# Patient Record
Sex: Female | Born: 1955
Health system: Southern US, Community
[De-identification: ages and names within clinical notes are randomized; demographics above are authoritative.]

## PROBLEM LIST (undated history)

## (undated) DIAGNOSIS — I10 Essential (primary) hypertension: Secondary | ICD-10-CM

## (undated) HISTORY — PX: ABDOMINAL HYSTERECTOMY: SHX81

---

## 2008-11-15 ENCOUNTER — Other Ambulatory Visit: Admission: RE | Admit: 2008-11-15 | Discharge: 2008-11-15 | Payer: Self-pay | Admitting: Obstetrics and Gynecology

## 2014-04-23 ENCOUNTER — Encounter (HOSPITAL_BASED_OUTPATIENT_CLINIC_OR_DEPARTMENT_OTHER): Payer: Self-pay | Admitting: Emergency Medicine

## 2014-04-23 ENCOUNTER — Emergency Department (HOSPITAL_BASED_OUTPATIENT_CLINIC_OR_DEPARTMENT_OTHER)
Admission: EM | Admit: 2014-04-23 | Discharge: 2014-04-23 | Disposition: A | Discharge status: Home / Self Care | Payer: Federal, State, Local not specified - PPO | Attending: Emergency Medicine | Admitting: Emergency Medicine

## 2014-04-23 DIAGNOSIS — Z79899 Other long term (current) drug therapy: Secondary | ICD-10-CM | POA: Insufficient documentation

## 2014-04-23 DIAGNOSIS — I10 Essential (primary) hypertension: Secondary | ICD-10-CM | POA: Diagnosis not present

## 2014-04-23 DIAGNOSIS — H6501 Acute serous otitis media, right ear: Secondary | ICD-10-CM | POA: Diagnosis not present

## 2014-04-23 DIAGNOSIS — H9201 Otalgia, right ear: Secondary | ICD-10-CM | POA: Diagnosis present

## 2014-04-23 HISTORY — DX: Essential (primary) hypertension: I10

## 2014-04-23 MED ORDER — AMOXICILLIN 500 MG PO CAPS: 500.0000 mg | ORAL_CAPSULE | Freq: Three times a day (TID) | ORAL | Status: AC

## 2014-04-23 NOTE — ED Notes (Signed)
Right ear pain x one week.  No fever or drainage.  Muffled hearing.

## 2014-04-23 NOTE — ED Provider Notes (Signed)
CSN: [id number]     Arrival date & time 04/23/14  1841 History   First MD Initiated Contact with Patient 04/23/14 1941     Chief Complaint  Patient presents with  . Otalgia     (Consider location/radiation/quality/duration/timing/severity/associated sxs/prior Treatment) HPI Comments: Pt states that she feel like sounds are muffled.   Patient is a 58 y.o. female presenting with ear pain. The history is provided by the patient. No language interpreter was used.  Otalgia Location:  Right Behind ear:  No abnormality Quality:  Aching Severity:  Mild Onset quality:  Gradual Timing:  Constant Progression:  Unchanged Associated symptoms: no ear discharge and no fever     Past Medical History  Diagnosis Date  . Hypertension    Past Surgical History  Procedure Laterality Date  . Abdominal hysterectomy     No family history on file. History  Substance Use Topics  . Smoking status: Never Smoker   . Smokeless tobacco: Not on file  . Alcohol Use: Not on file   OB History   Grav Para Term Preterm Abortions TAB SAB Ect Mult Living                 Review of Systems  Constitutional: Negative for fever.  HENT: Positive for ear pain. Negative for ear discharge.       Allergies  Review of patient's allergies indicates no known allergies.  Home Medications   Prior to Admission medications   Medication Sig Start Date End Date Taking? Authorizing Provider  Fish Oil-Cholecalciferol (FISH OIL + D3 PO) Take by mouth.   Yes Historical Provider, MD  hydrochlorothiazide (HYDRODIURIL) 12.5 MG tablet Take 12.5 mg by mouth daily.   Yes Historical Provider, MD  losartan (COZAAR) 100 MG tablet Take 100 mg by mouth daily.   Yes Historical Provider, MD  amoxicillin (AMOXIL) 500 MG capsule Take 1 capsule (500 mg total) by mouth 3 (three) times daily. 04/23/14   Teressa LowerVrinda Lucille Crichlow, NP   BP 160/80  Pulse 74  Temp(Src) 98.4 F (36.9 C) (Oral)  Resp 18  Ht 5\' 4"  (1.626 m)  Wt 207 lb (93.895  kg)  BMI 35.51 kg/m2  SpO2 98% Physical Exam  Nursing note and vitals reviewed. Constitutional: She appears well-developed and well-nourished.  HENT:  Head: Atraumatic.  Right Ear: Tympanic membrane and external ear normal.  Left Ear: There is tenderness. Tympanic membrane is erythematous.  Mouth/Throat: Oropharynx is clear and moist.  Cardiovascular: Normal rate and regular rhythm.   Pulmonary/Chest: Effort normal and breath sounds normal.    ED Course  Procedures (including critical care time) Labs Review Labs Reviewed - No data to display  Imaging Review No results found.   EKG Interpretation None      MDM   Final diagnoses:  Right acute serous otitis media, recurrence not specified    Will treat with antibiotics. Discussed follow up as needed. No jaw pain or neck pain.     Teressa LowerVrinda Damonie Furney, NP 04/23/14 [phone]

## 2014-04-23 NOTE — Discharge Instructions (Signed)

## 2014-04-24 NOTE — ED Provider Notes (Signed)
Medical screening examination/treatment/procedure(s) were performed by non-physician practitioner and as supervising physician I was immediately available for consultation/collaboration.   EKG Interpretation None        Matthew Gentry, MD 04/24/14 2348 

## 2018-06-10 ENCOUNTER — Emergency Department (HOSPITAL_BASED_OUTPATIENT_CLINIC_OR_DEPARTMENT_OTHER): Payer: Federal, State, Local not specified - PPO

## 2018-06-10 ENCOUNTER — Encounter (HOSPITAL_BASED_OUTPATIENT_CLINIC_OR_DEPARTMENT_OTHER): Payer: Self-pay | Admitting: Emergency Medicine

## 2018-06-10 ENCOUNTER — Other Ambulatory Visit: Payer: Self-pay

## 2018-06-10 ENCOUNTER — Emergency Department (HOSPITAL_BASED_OUTPATIENT_CLINIC_OR_DEPARTMENT_OTHER)
Admission: EM | Admit: 2018-06-10 | Discharge: 2018-06-10 | Disposition: A | Discharge status: Home / Self Care | Payer: Federal, State, Local not specified - PPO | Attending: Emergency Medicine | Admitting: Emergency Medicine

## 2018-06-10 DIAGNOSIS — Z79899 Other long term (current) drug therapy: Secondary | ICD-10-CM | POA: Insufficient documentation

## 2018-06-10 DIAGNOSIS — I10 Essential (primary) hypertension: Secondary | ICD-10-CM | POA: Insufficient documentation

## 2018-06-10 DIAGNOSIS — R05 Cough: Secondary | ICD-10-CM | POA: Insufficient documentation

## 2018-06-10 DIAGNOSIS — R059 Cough, unspecified: Secondary | ICD-10-CM

## 2018-06-10 MED ORDER — BENZONATATE 100 MG PO CAPS: 100.0000 mg | ORAL_CAPSULE | Freq: Three times a day (TID) | ORAL | 0 refills | Status: AC

## 2018-06-10 MED ORDER — PREDNISONE 10 MG (21) PO TBPK: ORAL_TABLET | ORAL | 0 refills | Status: AC

## 2018-06-10 MED ORDER — FLUTICASONE PROPIONATE 50 MCG/ACT NA SUSP: 2.0000 | Freq: Every day | NASAL | 0 refills | Status: AC

## 2018-06-10 MED ORDER — AZITHROMYCIN 250 MG PO TABS: 250.0000 mg | ORAL_TABLET | Freq: Every day | ORAL | 0 refills | Status: AC

## 2018-06-10 MED ORDER — CETIRIZINE HCL 10 MG PO TABS: 10.0000 mg | ORAL_TABLET | Freq: Every day | ORAL | 0 refills | Status: AC

## 2018-06-10 MED FILL — CETIRIZINE HCL 10 MG TABS: 10 | 100 days supply | Qty: 100 | Fill #0

## 2018-06-10 MED FILL — predniSONE 10 MG TABS: 10 | 6 days supply | Qty: 21 | Fill #0

## 2018-06-10 MED FILL — AZITHROMYCIN 250 MG TABLET: 250 | 5 days supply | Qty: 6 | Fill #0

## 2018-06-10 MED FILL — FLUTICASONE PROP 50 MCG SPR: 50 | 30 days supply | Qty: 16 | Fill #0

## 2018-06-10 MED FILL — BENZONATATE 100 MG CAP: 100 | 7 days supply | Qty: 21 | Fill #0

## 2018-06-10 NOTE — ED Provider Notes (Signed)
[facility] EMERGENCY DEPARTMENT Provider Note   CSN: [id number] Arrival date & time: 06/10/18  0906     History   Chief Complaint Chief Complaint  Patient presents with  . Cough    HPI Amanda Stephens is a 62 y.o. female.  HPI   Amanda Stephens is a 62 y.o. female, with a history of HTN, presenting to the ED with productive cough for the last 3 weeks that she states has recently worsened.  Accompanied by nasal congestion. Denies fever, body aches, shortness of breath, chest pain, abdominal pain, N/V/D, or any other complaints.   Past Medical History:  Diagnosis Date  . Hypertension     There are no active problems to display for this patient.   Past Surgical History:  Procedure Laterality Date  . ABDOMINAL HYSTERECTOMY       OB History   None      Home Medications    Prior to Admission medications   Medication Sig Start Date End Date Taking? Authorizing Provider  amoxicillin (AMOXIL) 500 MG capsule Take 1 capsule (500 mg total) by mouth 3 (three) times daily. 04/23/14   Teressa Lower, NP  azithromycin (ZITHROMAX) 250 MG tablet Take 1 tablet (250 mg total) by mouth daily. Take first 2 tablets together, then 1 every day until finished. 06/10/18   Joy, Shawn C, PA-C  benzonatate (TESSALON) 100 MG capsule Take 1 capsule (100 mg total) by mouth every 8 (eight) hours. 06/10/18   Joy, Shawn C, PA-C  cetirizine (ZYRTEC ALLERGY) 10 MG tablet Take 1 tablet (10 mg total) by mouth daily. 06/10/18 07/10/18  Joy, Shawn C, PA-C  Fish Oil-Cholecalciferol (FISH OIL + D3 PO) Take by mouth.    [provider]  fluticasone (FLONASE) 50 MCG/ACT nasal spray Place 2 sprays into both nostrils daily. 06/10/18   Joy, Shawn C, PA-C  hydrochlorothiazide (HYDRODIURIL) 12.5 MG tablet Take 12.5 mg by mouth daily.    [provider]  losartan (COZAAR) 100 MG tablet Take 100 mg by mouth daily.    [provider]  predniSONE (STERAPRED UNI-PAK 21 TAB) 10 MG  (21) TBPK tablet Take 6 tabs (60mg ) day 1, 5 tabs (50mg ) day 2, 4 tabs (40mg ) day 3, 3 tabs (30mg ) day 4, 2 tabs (20mg ) day 5, and 1 tab (10mg ) day 6. 06/10/18   Joy, Hillard Danker, PA-C    Family History History reviewed. No pertinent family history.  Social History Social History   Tobacco Use  . Smoking status: Never Smoker  Substance Use Topics  . Alcohol use: Not on file  . Drug use: Not on file     Allergies   Patient has no known allergies.   Review of Systems Review of Systems  Constitutional: Negative for chills and fever.  HENT: Positive for congestion and rhinorrhea. Negative for trouble swallowing and voice change.   Respiratory: Positive for cough. Negative for shortness of breath.   Cardiovascular: Negative for chest pain.  Gastrointestinal: Negative for abdominal pain, diarrhea, nausea and vomiting.  All other systems reviewed and are negative.    Physical Exam Updated Vital Signs BP 138/87   Pulse 75   Temp 98.3 F (36.8 C) (Oral)   Resp 20   Ht 5\' 4"  (1.626 m)   Wt 94.3 kg   SpO2 100%   BMI 35.70 kg/m   Physical Exam  Constitutional: She appears well-developed and well-nourished. No distress.  HENT:  Head: Normocephalic and atraumatic.  Nose: Mucosal edema present. Right  sinus exhibits no maxillary sinus tenderness and no frontal sinus tenderness. Left sinus exhibits no maxillary sinus tenderness and no frontal sinus tenderness.  Eyes: Conjunctivae are normal.  Neck: Neck supple.  Cardiovascular: Normal rate, regular rhythm, normal heart sounds and intact distal pulses.  Pulmonary/Chest: Effort normal and breath sounds normal. No respiratory distress.  Abdominal: Soft. There is no tenderness. There is no guarding.  Musculoskeletal: She exhibits no edema.  Lymphadenopathy:    She has no cervical adenopathy.  Neurological: She is alert.  Skin: Skin is warm and dry. She is not diaphoretic.  Psychiatric: She has a normal mood and affect. Her behavior  is normal.  Nursing note and vitals reviewed.    ED Treatments / Results  Labs (all labs ordered are listed, but only abnormal results are displayed) Labs Reviewed - No data to display  EKG None  Radiology Dg Chest 2 View  Result Date: 06/10/2018 CLINICAL DATA:  Cough and chest congestion for the past 3 weeks. EXAM: CHEST - 2 VIEW COMPARISON:  None. FINDINGS: The lungs are adequately inflated. There is no focal infiltrate. There is no pleural effusion. The heart and pulmonary vascularity are normal. The mediastinum is normal in width. The bony thorax is unremarkable. IMPRESSION: There is no pneumonia, CHF, nor other acute cardiopulmonary abnormality. Electronically Signed   By: David  SwazilandJordan M.D.   On: 06/10/2018 09:54    Procedures Procedures (including critical care time)  Medications Ordered in ED Medications - No data to display   Initial Impression / Assessment and Plan / ED Course  I have reviewed the triage vital signs and the nursing notes.  Pertinent labs & imaging results that were available during my care of the patient were reviewed by me and considered in my medical decision making (see chart for details).     Patient presents with cough and upper respiratory congestion.  Symptomatic care discussed.  Antibiotic initiated due to duration of symptoms and recent worsening. The patient was given instructions for home care as well as return precautions. Patient voices understanding of these instructions, accepts the plan, and is comfortable with discharge.    Final Clinical Impressions(s) / ED Diagnoses   Final diagnoses:  Cough    ED Discharge Orders         Ordered    predniSONE (STERAPRED UNI-PAK 21 TAB) 10 MG (21) TBPK tablet     06/10/18 1027    fluticasone (FLONASE) 50 MCG/ACT nasal spray  Daily     06/10/18 1027    cetirizine (ZYRTEC ALLERGY) 10 MG tablet  Daily     06/10/18 1027    azithromycin (ZITHROMAX) 250 MG tablet  Daily     06/10/18 1027     benzonatate (TESSALON) 100 MG capsule  Every 8 hours     06/10/18 1044           Anselm PancoastJoy, Shawn C, PA-C 06/10/18 2203    Geoffery Lyonselo, Douglas, MD 06/13/18 1511

## 2018-06-10 NOTE — Discharge Instructions (Addendum)
°  Hand washing: Wash your hands throughout the day, but especially before and after touching the face, using the restroom, sneezing, coughing, or touching surfaces that have been coughed or sneezed upon. Hydration: Symptoms will be intensified and complicated by dehydration. Dehydration can also extend the duration of symptoms. Drink plenty of fluids and get plenty of rest. You should be drinking at least half a liter of water an hour to stay hydrated. Electrolyte drinks (ex. Gatorade, Powerade, Pedialyte) are also encouraged. You should be drinking enough fluids to make your urine light yellow, almost clear. If this is not the case, you are not drinking enough water. Please note that some of the treatments indicated below will not be effective if you are not adequately hydrated. Pain or fever: Ibuprofen, Naproxen, or acetaminophen (generic for Tylenol) for pain or fever.  Cough: Use the benzonatate (generic for Tessalon) for cough.  Prednisone: Take the prednisone, as directed, in its entirety. Zyrtec or Claritin: May add these medication daily to control underlying symptoms of congestion, sneezing, and other signs of allergies.  These medications are available over-the-counter. Generics: Cetirizine (generic for Zyrtec) and loratadine (generic for Claritin). Fluticasone: Use fluticasone (generic for Flonase), as directed, for nasal and sinus congestion.  This medication is available over-the-counter. Congestion: Plain guaifenesin (generic for plain Mucinex) may help relieve congestion. Saline sinus rinses and saline nasal sprays may also help relieve congestion.  Sore throat: Warm liquids or Chloraseptic spray may help soothe a sore throat. Gargle twice a day with a salt water solution made from a half teaspoon of salt in a cup of warm water.  Follow up: Follow up with a primary care provider within the next two weeks should symptoms fail to resolve. Return: Return to the ED for significantly worsening  symptoms, shortness of breath, persistent vomiting, or any other major concerns.  For prescription assistance, may try using prescription discount sites or apps, such as goodrx.com

## 2018-06-10 NOTE — ED Triage Notes (Signed)
Reports productive cough x 3 weeks.  Denies fevers.

## 2019-10-27 IMAGING — DX DG CHEST 2V
2 series · 2 of 2 positions shown · non-contrast
Comparison: None.

CLINICAL DATA: Cough and chest congestion for the past 3 weeks.

EXAM:
CHEST - 2 VIEW

[chest pa]
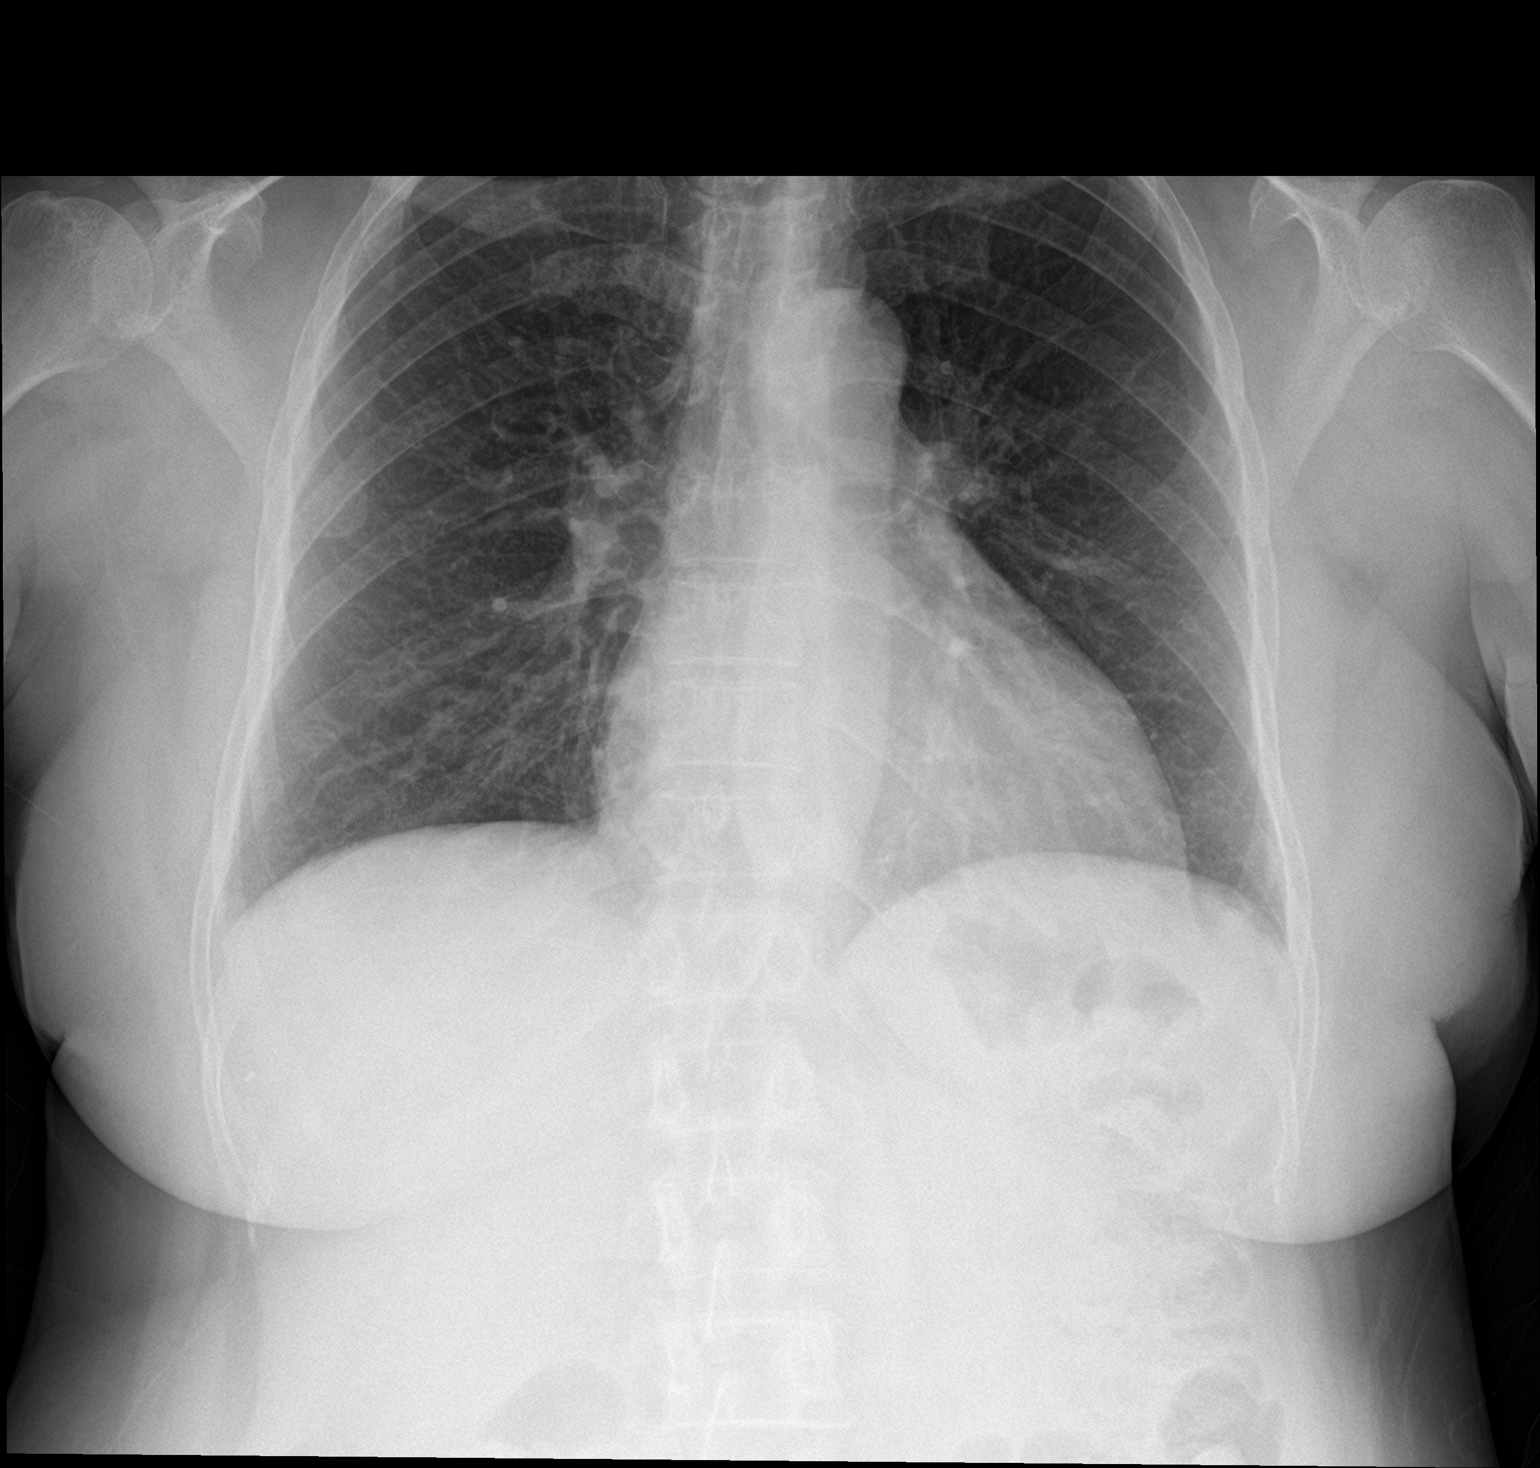

[chest lat]
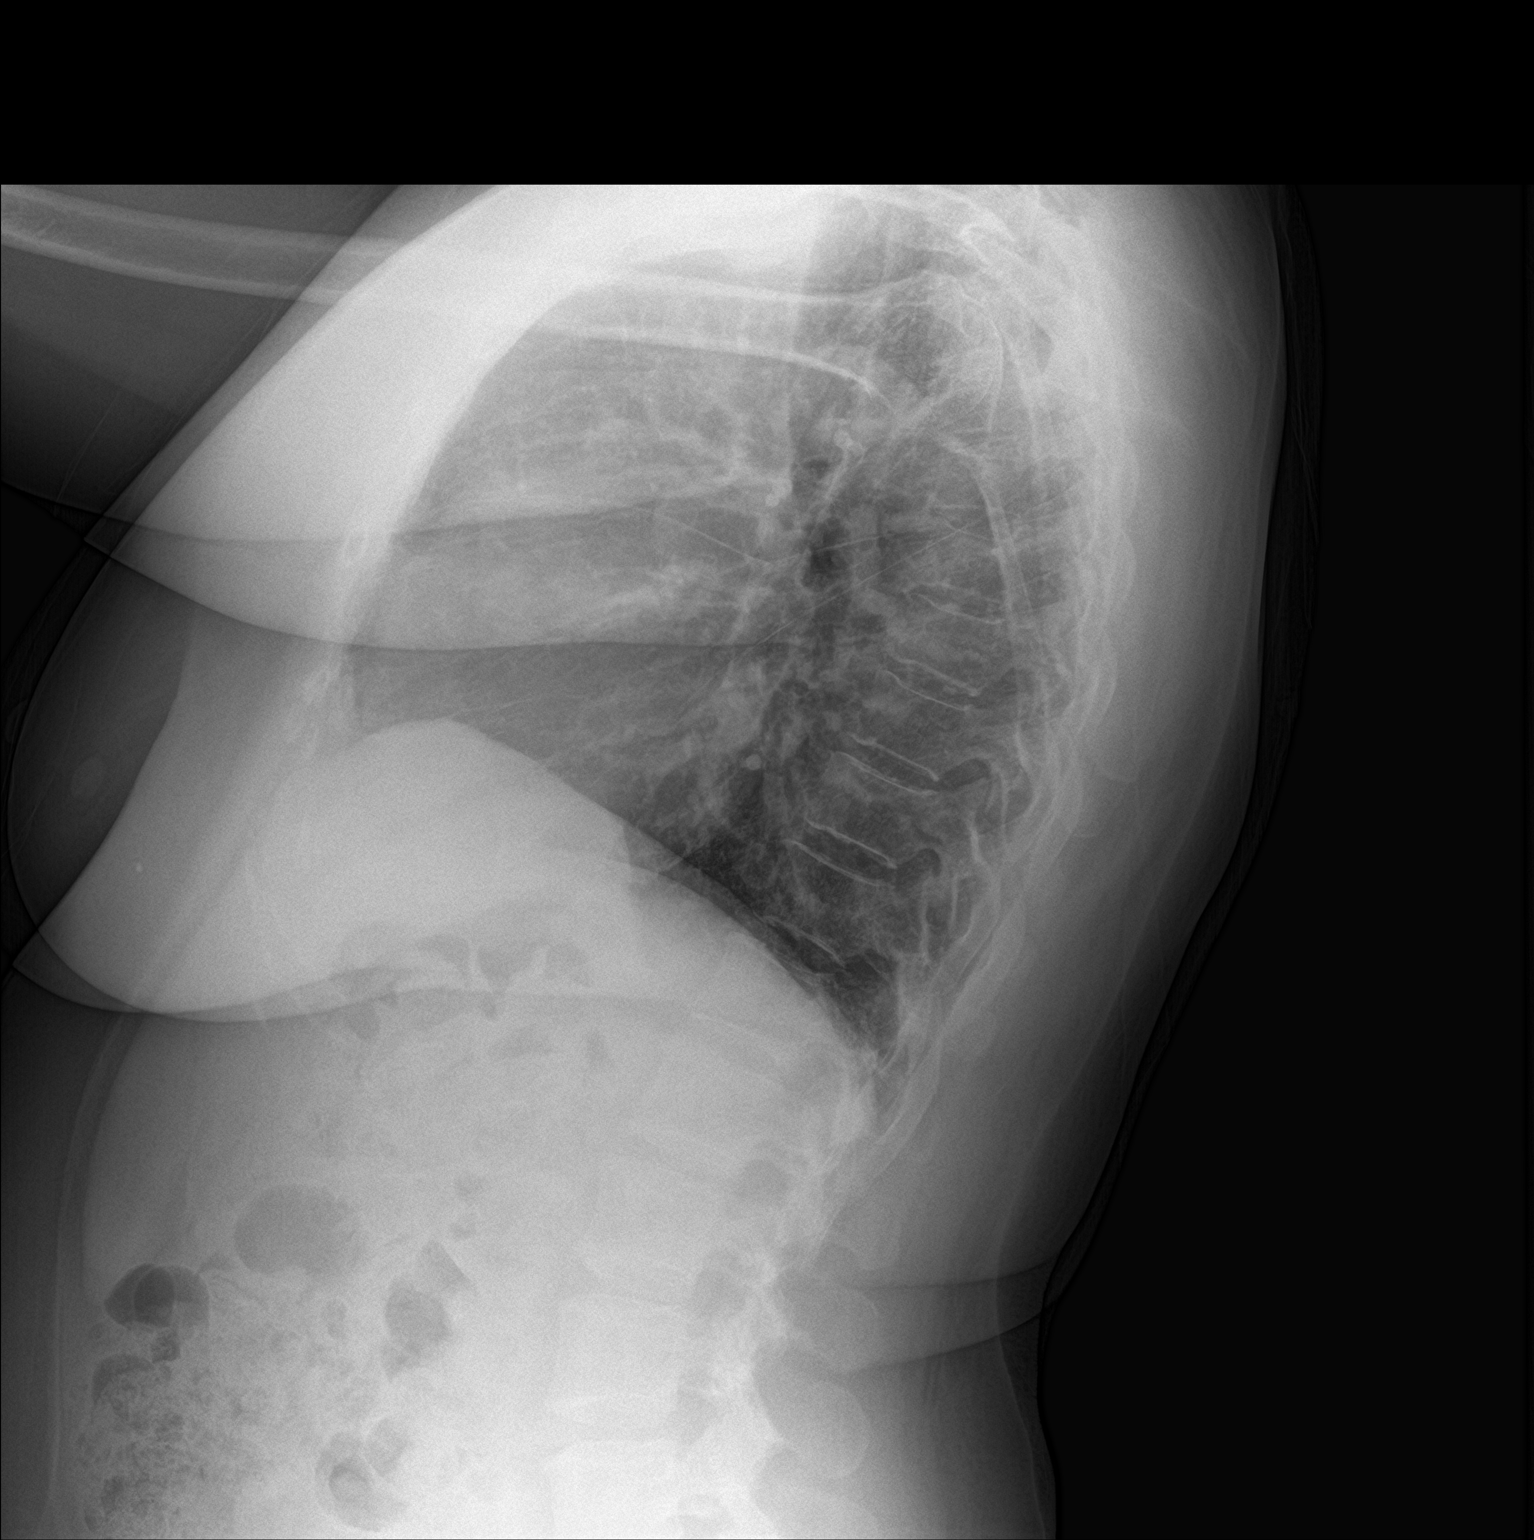

[2 of 2 positions shown; findings below may reference images not displayed]

FINDINGS: The lungs are adequately inflated. There is no focal infiltrate.
There is no pleural effusion. The heart and pulmonary vascularity
are normal. The mediastinum is normal in width. The bony thorax is
unremarkable.
IMPRESSION: There is no pneumonia, CHF, nor other acute cardiopulmonary
abnormality.

## 2023-12-10 ENCOUNTER — Emergency Department (HOSPITAL_BASED_OUTPATIENT_CLINIC_OR_DEPARTMENT_OTHER)
Admission: EM | Admit: 2023-12-10 | Discharge: 2023-12-10 | Disposition: A | Discharge status: Home / Self Care | Attending: Emergency Medicine | Admitting: Emergency Medicine

## 2023-12-10 ENCOUNTER — Other Ambulatory Visit: Payer: Self-pay

## 2023-12-10 DIAGNOSIS — H109 Unspecified conjunctivitis: Secondary | ICD-10-CM | POA: Insufficient documentation

## 2023-12-10 DIAGNOSIS — H5712 Ocular pain, left eye: Secondary | ICD-10-CM | POA: Diagnosis present

## 2023-12-10 MED ORDER — ERYTHROMYCIN 5 MG/GM OP OINT
TOPICAL_OINTMENT | Freq: Once | OPHTHALMIC | Status: AC
  Filled 2023-12-10: qty 3.5

## 2023-12-10 MED ORDER — FLUORESCEIN SODIUM 1 MG OP STRP
1.0000 | ORAL_STRIP | Freq: Once | OPHTHALMIC | Status: AC
  Administered 2023-12-10: 1 via OPHTHALMIC
  Filled 2023-12-10: qty 1

## 2023-12-10 MED ORDER — TETRACAINE HCL 0.5 % OP SOLN
2.0000 [drp] | Freq: Once | OPHTHALMIC | Status: AC
  Administered 2023-12-10: 2 [drp] via OPHTHALMIC
  Filled 2023-12-10: qty 4

## 2023-12-10 NOTE — ED Provider Notes (Signed)
 [facility]ENC[facility] DEPARTMENT AT [facility] Provider Note   CSN: [id number] Arrival date & time: 12/10/23  0815     History  Chief Complaint  Patient presents with   Eye Problem    Amanda Stephens is a 68 y.o. female here with left eye pain and redness for 2 days.  Felt like "something was stuck in my eye" yesterday and more painful this morning.  Denies trauma.  Does not wear contacts.  Denies hx of eye surgery or diabetes.   Patient reports that she does not typically wear mascara but they try to put some on 2 days ago and has had reactions and irritation to mascara in the past.  She said it was a new bottle  HPI     Home Medications Prior to Admission medications   Medication Sig Start Date End Date Taking? Authorizing Provider  amoxicillin  (AMOXIL ) 500 MG capsule Take 1 capsule (500 mg total) by mouth 3 (three) times daily. 04/23/14   Pickering, Vrinda, NP  azithromycin  (ZITHROMAX ) 250 MG tablet Take 1 tablet (250 mg total) by mouth daily. Take first 2 tablets together, then 1 every day until finished. 06/10/18   Joy, Shawn C, PA-C  benzonatate  (TESSALON ) 100 MG capsule Take 1 capsule (100 mg total) by mouth every 8 (eight) hours. 06/10/18   Joy, Shawn C, PA-C  cetirizine  (ZYRTEC  ALLERGY) 10 MG tablet Take 1 tablet (10 mg total) by mouth daily. 06/10/18 07/10/18  Joy, Shawn C, PA-C  Fish Oil-Cholecalciferol (FISH OIL + D3 PO) Take by mouth.    [provider]  fluticasone  (FLONASE ) 50 MCG/ACT nasal spray Place 2 sprays into both nostrils daily. 06/10/18   Joy, Shawn C, PA-C  hydrochlorothiazide (HYDRODIURIL) 12.5 MG tablet Take 12.5 mg by mouth daily.    [provider]  losartan (COZAAR) 100 MG tablet Take 100 mg by mouth daily.    [provider]  predniSONE  (STERAPRED UNI-PAK 21 TAB) 10 MG (21) TBPK tablet Take 6 tabs (60mg ) day 1, 5 tabs (50mg ) day 2, 4 tabs (40mg ) day 3, 3 tabs (30mg ) day 4, 2 tabs (20mg ) day 5, and 1 tab (10mg ) day 6.  06/10/18   Joy, Shawn C, PA-C      Allergies    Patient has no known allergies.    Review of Systems   Review of Systems  Physical Exam Updated Vital Signs BP 139/71   Pulse 97   Temp 98.2 F (36.8 C) (Oral)   Resp 18   SpO2 100%  Physical Exam Constitutional:      General: She is not in acute distress. HENT:     Head: Normocephalic and atraumatic.  Eyes:     Pupils: Pupils are equal, round, and reactive to light.     Comments: Left conjunctiva injected No hypopion or hyphema noted Vision 20/30 both eyes No abnormal fluorescein uptake noted  Cardiovascular:     Rate and Rhythm: Normal rate and regular rhythm.  Pulmonary:     Effort: Pulmonary effort is normal. No respiratory distress.  Skin:    General: Skin is warm and dry.  Neurological:     General: No focal deficit present.     Mental Status: She is alert. Mental status is at baseline.  Psychiatric:        Mood and Affect: Mood normal.        Behavior: Behavior normal.     ED Results / Procedures / Treatments   Labs (all labs ordered  are listed, but only abnormal results are displayed) Labs Reviewed - No data to display  EKG None  Radiology No results found.  Procedures Procedures    Medications Ordered in ED Medications  erythromycin ophthalmic ointment (has no administration in time range)  fluorescein ophthalmic strip 1 strip (1 strip Both Eyes Given 12/10/23 0845)  tetracaine (PONTOCAINE) 0.5 % ophthalmic solution 2 drop (2 drops Both Eyes Given 12/10/23 0845)    ED Course/ Medical Decision Making/ A&P                                 Medical Decision Making Risk Prescription drug management.   Patient here with left eye redness and pain.  Her vision appears preserved.  I do not see evidence of intraocular infection, doubt acute angle-closure glaucoma.  She has some chemosis and injection of the conjunctiva, which could be related to mascara use, alternatively infectious conjunctivitis.  I  think is reasonable to treat with erythromycin ointment.  She did have relief of her pain with topical tetracaine, suggestive of a more superficial process.  No further testing or imaging indicated at this time.  Return precautions discussed        Final Clinical Impression(s) / ED Diagnoses Final diagnoses:  Conjunctivitis of left eye, unspecified conjunctivitis type    Rx / DC Orders ED Discharge Orders     None         Tai Syfert, Janalyn Me, MD 12/10/23 [phone]

## 2023-12-10 NOTE — ED Triage Notes (Signed)
 Pt reports left eye swelling. States, "feeling like there was a hair in my eye" yesterday. This AM it became swollen and tender to touch. Eye started to drain fluid. Pt reports blurred vision.  Denies any injury or trauma to eye.

## 2023-12-10 NOTE — Discharge Instructions (Addendum)
 You use erythromycin antibiotic ointment 4 times a day for the next 5 days, spreading a pea-sized amount carefully across your left lower eyelid and then letting it wash over your eye.  Make sure to wash your hands thoroughly before and after touching your face.  You have a form of pinkeye, which may be chemical pinkeye or infectious pinkeye.  I included information for you to read about.  Make sure to wash your hands for the next week particularly anytime you touch your face, as certain types of pinkeye can be contagious and spread by touch.

## 2023-12-10 NOTE — ED Notes (Addendum)
 Visual acuity completed per order. Patient states blurry vision in left eye. Wears glasses.

## 2024-02-12 ENCOUNTER — Emergency Department (HOSPITAL_BASED_OUTPATIENT_CLINIC_OR_DEPARTMENT_OTHER)
Admission: EM | Admit: 2024-02-12 | Discharge: 2024-02-13 | Disposition: A | Discharge status: Home / Self Care | Attending: Emergency Medicine | Admitting: Emergency Medicine

## 2024-02-12 ENCOUNTER — Encounter (HOSPITAL_BASED_OUTPATIENT_CLINIC_OR_DEPARTMENT_OTHER): Payer: Self-pay | Admitting: Emergency Medicine

## 2024-02-12 ENCOUNTER — Other Ambulatory Visit: Payer: Self-pay

## 2024-02-12 DIAGNOSIS — K1120 Sialoadenitis, unspecified: Secondary | ICD-10-CM

## 2024-02-12 DIAGNOSIS — R220 Localized swelling, mass and lump, head: Secondary | ICD-10-CM | POA: Diagnosis present

## 2024-02-12 NOTE — ED Triage Notes (Signed)
 Pt c/o shooting pain to RT jaw tonight; she noticed there was swelling a little later; denies dental pain

## 2024-02-13 ENCOUNTER — Emergency Department (HOSPITAL_BASED_OUTPATIENT_CLINIC_OR_DEPARTMENT_OTHER)

## 2024-02-13 LAB — CBC WITH DIFFERENTIAL/PLATELET
Abs Immature Granulocytes: 0.05 K/uL (ref 0.00–0.07)
Basophils Absolute: 0.1 K/uL (ref 0.0–0.1)
Basophils Relative: 1 %
Eosinophils Absolute: 0.3 K/uL (ref 0.0–0.5)
Eosinophils Relative: 4 %
HCT: 32.7 % — ABNORMAL LOW (ref 36.0–46.0)
Hemoglobin: 11 g/dL — ABNORMAL LOW (ref 12.0–15.0)
Immature Granulocytes: 1 %
Lymphocytes Relative: 32 %
Lymphs Abs: 2.2 K/uL (ref 0.7–4.0)
MCH: 28.1 pg (ref 26.0–34.0)
MCHC: 33.6 g/dL (ref 30.0–36.0)
MCV: 83.6 fL (ref 80.0–100.0)
Monocytes Absolute: 0.4 K/uL (ref 0.1–1.0)
Monocytes Relative: 6 %
Neutro Abs: 3.8 K/uL (ref 1.7–7.7)
Neutrophils Relative %: 56 %
Platelets: 273 K/uL (ref 150–400)
RBC: 3.91 MIL/uL (ref 3.87–5.11)
RDW: 12.6 % (ref 11.5–15.5)
WBC: 6.8 K/uL (ref 4.0–10.5)
nRBC: 0 % (ref 0.0–0.2)

## 2024-02-13 LAB — BASIC METABOLIC PANEL WITH GFR
Anion gap: 11 (ref 5–15)
BUN: 14 mg/dL (ref 8–23)
CO2: 26 mmol/L (ref 22–32)
Calcium: 9.3 mg/dL (ref 8.9–10.3)
Chloride: 105 mmol/L (ref 98–111)
Creatinine, Ser: 0.9 mg/dL (ref 0.44–1.00)
GFR, Estimated: >60 mL/min (ref >60)
Glucose, Bld: 99 mg/dL (ref 70–99)
Potassium: 3.2 mmol/L — ABNORMAL LOW (ref 3.5–5.1)
Sodium: 142 mmol/L (ref 135–145)

## 2024-02-13 MED ORDER — IOHEXOL 300 MG/ML  SOLN
75.0000 mL | Freq: Once | INTRAMUSCULAR | Status: AC | PRN
  Administered 2024-02-13: 75 mL via INTRAVENOUS

## 2024-02-13 MED ORDER — AMOXICILLIN-POT CLAVULANATE 875-125 MG PO TABS
1.0000 | ORAL_TABLET | Freq: Once | ORAL | Status: AC
  Administered 2024-02-13: 1 via ORAL
  Filled 2024-02-13: qty 1

## 2024-02-13 MED ORDER — POTASSIUM CHLORIDE CRYS ER 20 MEQ PO TBCR
40.0000 meq | EXTENDED_RELEASE_TABLET | Freq: Once | ORAL | Status: AC
  Administered 2024-02-13: 40 meq via ORAL
  Filled 2024-02-13: qty 2

## 2024-02-13 MED ORDER — AMOXICILLIN-POT CLAVULANATE 875-125 MG PO TABS: 1.0000 | ORAL_TABLET | Freq: Two times a day (BID) | ORAL | 0 refills | Status: AC

## 2024-02-13 NOTE — ED Provider Notes (Signed)
 [facility] Provider Note   CSN: [id number] Arrival date & time: 02/12/24  1933     Patient presents with: Facial Swelling   Amanda Stephens is a 68 y.o. female who presents to the ED today with a complaint of right jaw discomfort and swelling.  She denies having any dental pain, though states that she has pain that radiates from the jaw that is primarily when she opens her mouth.  Further worsened with jaw clenching and chewing.  No overt tenderness to the overlying area, she has had similar symptoms in the past however not nearly as severe and not with pain with opening of the mouth.   HPI     Prior to Admission medications   Medication Sig Start Date End Date Taking? Authorizing Provider  amoxicillin  (AMOXIL ) 500 MG capsule Take 1 capsule (500 mg total) by mouth 3 (three) times daily. 04/23/14   Pickering, Vrinda, NP  azithromycin  (ZITHROMAX ) 250 MG tablet Take 1 tablet (250 mg total) by mouth daily. Take first 2 tablets together, then 1 every day until finished. 06/10/18   Joy, Shawn C, PA-C  benzonatate  (TESSALON ) 100 MG capsule Take 1 capsule (100 mg total) by mouth every 8 (eight) hours. 06/10/18   Joy, Shawn C, PA-C  cetirizine  (ZYRTEC  ALLERGY) 10 MG tablet Take 1 tablet (10 mg total) by mouth daily. 06/10/18 07/10/18  Joy, Shawn C, PA-C  Fish Oil-Cholecalciferol (FISH OIL + D3 PO) Take by mouth.    [provider]  fluticasone  (FLONASE ) 50 MCG/ACT nasal spray Place 2 sprays into both nostrils daily. 06/10/18   Joy, Shawn C, PA-C  hydrochlorothiazide (HYDRODIURIL) 12.5 MG tablet Take 12.5 mg by mouth daily.    [provider]  losartan (COZAAR) 100 MG tablet Take 100 mg by mouth daily.    [provider]  predniSONE  (STERAPRED UNI-PAK 21 TAB) 10 MG (21) TBPK tablet Take 6 tabs (60mg ) day 1, 5 tabs (50mg ) day 2, 4 tabs (40mg ) day 3, 3 tabs (30mg ) day 4, 2 tabs (20mg ) day 5, and 1 tab (10mg ) day 6. 06/10/18   Joy,  Shawn C, PA-C    Allergies: Patient has no known allergies.    Review of Systems  HENT:  Positive for facial swelling.   All other systems reviewed and are negative.   Updated Vital Signs BP (!) 169/79 (BP Location: Right Arm)   Pulse 96   Temp (!) 97.5 F (36.4 C)   Resp 18   Ht 5' 4 (1.626 m)   Wt 95.3 kg   SpO2 99%   BMI 36.05 kg/m   Physical Exam Vitals and nursing note reviewed.  Constitutional:      General: She is not in acute distress.    Appearance: Normal appearance.  HENT:     Head: Normocephalic and atraumatic.     Jaw: Swelling present.     Salivary Glands: Right salivary gland is diffusely enlarged.     Comments: Enlargement on the right parotid gland noted without any overt tenderness    Right Ear: Hearing, tympanic membrane, ear canal and external ear normal.     Left Ear: Hearing, tympanic membrane, ear canal and external ear normal.     Mouth/Throat:     Mouth: Mucous membranes are moist.     Pharynx: Oropharynx is clear. Uvula midline.     Comments: No dental injuries/caries noted. Eyes:     Extraocular Movements: Extraocular movements intact.  Conjunctiva/sclera: Conjunctivae normal.     Pupils: Pupils are equal, round, and reactive to light.  Cardiovascular:     Rate and Rhythm: Normal rate and regular rhythm.     Pulses: Normal pulses.     Heart sounds: Normal heart sounds. No murmur heard.    No friction rub. No gallop.  Pulmonary:     Effort: Pulmonary effort is normal.     Breath sounds: Normal breath sounds.  Abdominal:     General: Abdomen is flat. Bowel sounds are normal.     Palpations: Abdomen is soft.  Musculoskeletal:        General: Normal range of motion.     Cervical back: Normal range of motion and neck supple.     Right lower leg: No edema.     Left lower leg: No edema.  Skin:    General: Skin is warm and dry.     Capillary Refill: Capillary refill takes less than 2 seconds.  Neurological:     General: No focal  deficit present.     Mental Status: She is alert. Mental status is at baseline.  Psychiatric:        Mood and Affect: Mood normal.     (all labs ordered are listed, but only abnormal results are displayed) Labs Reviewed  BASIC METABOLIC PANEL WITH GFR - Abnormal; Notable for the following components:      Result Value   Potassium 3.2 (*)    All other components within normal limits  CBC WITH DIFFERENTIAL/PLATELET - Abnormal; Notable for the following components:   Hemoglobin 11.0 (*)    HCT 32.7 (*)    All other components within normal limits    EKG: None  Radiology: No results found.   Procedures   Medications Ordered in the ED  iohexol  (OMNIPAQUE ) 300 MG/ML solution 75 mL (has no administration in time range)  potassium chloride  SA (KLOR-CON  M) CR tablet 40 mEq (has no administration in time range)    Clinical Course as of 02/13/24 0027  Sun Feb 13, 2024  0000 Facial swelling CT pending  [CC]  0024 Hemoglobin(!): 11.0 This is stable compared to findings in October 2024 with hemoglobin and care everywhere records showing 11.4 at that time. [JG]    Clinical Course User Index [CC] Amanda Meth, MD [JG] Amanda Dorn BROCKS, PA                                 Medical Decision Making Amount and/or Complexity of Data Reviewed Labs: ordered. Radiology: ordered.   Medical Decision Making:   [name] is a 68 y.o. female who presented to the ED today with right-sided facial swelling detailed above.     Complete initial physical exam performed, notably the patient  was awake alert in no apparent distress with notable exam finding of right-sided facial swelling without tenderness..    Reviewed and confirmed nursing documentation for past medical history, family history, social history.    Initial Assessment:   With the patient's presentation of right-sided facial swelling, most likely diagnosis is parotitis or sialoadenitis. Other diagnoses were  considered including (but not limited to) facial abscess, deep tissue infection.. These are considered less likely due to history of present illness and physical exam findings.     Initial Plan:  Obtain CT imaging of the soft tissues of the face and neck to assess for soft tissue abscess as well as  deep tissue infection. Screening labs including CBC and Metabolic panel to evaluate for infectious or metabolic etiology of disease.  Objective evaluation as below reviewed   Initial Study Results:   Laboratory  All laboratory results reviewed without evidence of clinically relevant pathology.   Exceptions include: Potassium 3.2, hemoglobin is 11    Radiology:  Imaging pending at time of care handoff   Reassessment and Plan:   At time of care handoff, pending imaging for this patient.  If imaging does not demonstrate any deep tissue infection or abscess, anticipate this patient should be stable for discharge with management for sialadenitis or parotitis.  Potassium was noted at 3.2 and given oral potassium for repletion.  Care handed off to see Dr. Jerral, MD.       Final diagnoses:  None    ED Discharge Orders     None          Amanda Dorn BROCKS, PA 02/13/24 9972    Amanda Meth, MD 02/13/24 [phone]

## 2024-08-11 ENCOUNTER — Other Ambulatory Visit: Payer: Self-pay | Admitting: *Deleted

## 2024-08-11 ENCOUNTER — Encounter: Payer: Self-pay | Admitting: *Deleted

## 2024-08-11 DIAGNOSIS — Z13220 Encounter for screening for lipoid disorders: Secondary | ICD-10-CM

## 2024-08-11 LAB — POCT ABI - SCREENING FOR PILOT NO CHARGE
Left ABI: 0.97
Right ABI: 1.03

## 2024-08-11 LAB — AMB RESULTS CONSOLE CBG: Glucose: 89

## 2024-08-11 NOTE — Progress Notes (Signed)
 Lipid Screening completed.

## 2024-08-11 NOTE — Progress Notes (Signed)
 Patient attended a screening event on 08/11/24 where her BP was 145/78 at second check and blood glucose was 89. Patients right ABI was 1.03 and left ABI was .97. Patient has a PCP, has insurance, and does not smoke. No SDOH needs indicated.
